# Patient Record
Sex: Female | Born: 2000 | Race: White | Hispanic: No | Marital: Single | Smoking: Never smoker
Health system: Southern US, Community
[De-identification: ages and names within clinical notes are randomized; demographics above are authoritative.]

## PROBLEM LIST (undated history)

## (undated) HISTORY — PX: WISDOM TOOTH EXTRACTION: SHX21

## (undated) HISTORY — PX: TONSILLECTOMY: SUR1361

---
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
